# Patient Record
Sex: Female | Born: 1995 | Race: White | Hispanic: No | Marital: Single | Smoking: Never smoker
Health system: Southern US, Community
[De-identification: ages and names within clinical notes are randomized; demographics above are authoritative.]

## PROBLEM LIST (undated history)

## (undated) DIAGNOSIS — F4310 Post-traumatic stress disorder, unspecified: Secondary | ICD-10-CM

## (undated) DIAGNOSIS — F419 Anxiety disorder, unspecified: Secondary | ICD-10-CM

## (undated) HISTORY — PX: DENTAL SURGERY: SHX609

---
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
## 2020-07-10 ENCOUNTER — Other Ambulatory Visit: Payer: Self-pay | Admitting: Orthopedic Surgery

## 2020-07-10 ENCOUNTER — Other Ambulatory Visit: Payer: Self-pay

## 2020-07-10 ENCOUNTER — Ambulatory Visit
Admission: RE | Admit: 2020-07-10 | Discharge: 2020-07-10 | Disposition: A | Discharge status: Home / Self Care | Payer: Worker's Compensation | Source: Ambulatory Visit | Attending: Orthopedic Surgery | Admitting: Orthopedic Surgery

## 2020-07-10 DIAGNOSIS — M25561 Pain in right knee: Secondary | ICD-10-CM

## 2022-09-16 ENCOUNTER — Ambulatory Visit: Payer: 59 | Admitting: Internal Medicine

## 2024-04-21 ENCOUNTER — Emergency Department (HOSPITAL_BASED_OUTPATIENT_CLINIC_OR_DEPARTMENT_OTHER): Payer: Self-pay

## 2024-04-21 ENCOUNTER — Encounter (HOSPITAL_BASED_OUTPATIENT_CLINIC_OR_DEPARTMENT_OTHER): Payer: Self-pay | Admitting: Emergency Medicine

## 2024-04-21 ENCOUNTER — Other Ambulatory Visit: Payer: Self-pay

## 2024-04-21 ENCOUNTER — Emergency Department (HOSPITAL_BASED_OUTPATIENT_CLINIC_OR_DEPARTMENT_OTHER)
Admission: EM | Admit: 2024-04-21 | Discharge: 2024-04-21 | Disposition: A | Discharge status: Home / Self Care | Payer: Self-pay | Attending: Emergency Medicine | Admitting: Emergency Medicine

## 2024-04-21 DIAGNOSIS — B9620 Unspecified Escherichia coli [E. coli] as the cause of diseases classified elsewhere: Secondary | ICD-10-CM | POA: Insufficient documentation

## 2024-04-21 DIAGNOSIS — Z91040 Latex allergy status: Secondary | ICD-10-CM | POA: Insufficient documentation

## 2024-04-21 DIAGNOSIS — R109 Unspecified abdominal pain: Secondary | ICD-10-CM

## 2024-04-21 DIAGNOSIS — N390 Urinary tract infection, site not specified: Secondary | ICD-10-CM | POA: Insufficient documentation

## 2024-04-21 DIAGNOSIS — D72829 Elevated white blood cell count, unspecified: Secondary | ICD-10-CM | POA: Insufficient documentation

## 2024-04-21 LAB — COMPREHENSIVE METABOLIC PANEL WITH GFR
ALT: 13 U/L (ref 0–44)
AST: 17 U/L (ref 15–41)
Albumin: 4.5 g/dL (ref 3.5–5.0)
Alkaline Phosphatase: 56 U/L (ref 38–126)
Anion gap: 13 (ref 5–15)
BUN: 9 mg/dL (ref 6–20)
CO2: 22 mmol/L (ref 22–32)
Calcium: 9 mg/dL (ref 8.9–10.3)
Chloride: 107 mmol/L (ref 98–111)
Creatinine, Ser: 0.97 mg/dL (ref 0.44–1.00)
GFR, Estimated: >60 mL/min (ref >60)
Glucose, Bld: 90 mg/dL (ref 70–99)
Potassium: 3.9 mmol/L (ref 3.5–5.1)
Sodium: 141 mmol/L (ref 135–145)
Total Bilirubin: 0.4 mg/dL (ref 0.0–1.2)
Total Protein: 7.2 g/dL (ref 6.5–8.1)

## 2024-04-21 LAB — URINALYSIS, MICROSCOPIC (REFLEX)

## 2024-04-21 LAB — CBC WITH DIFFERENTIAL/PLATELET
Abs Immature Granulocytes: 0.05 K/uL (ref 0.00–0.07)
Basophils Absolute: 0.1 K/uL (ref 0.0–0.1)
Basophils Relative: 1 %
Eosinophils Absolute: 0.1 K/uL (ref 0.0–0.5)
Eosinophils Relative: 1 %
HCT: 40.8 % (ref 36.0–46.0)
Hemoglobin: 13.9 g/dL (ref 12.0–15.0)
Immature Granulocytes: 0 %
Lymphocytes Relative: 20 %
Lymphs Abs: 2.7 K/uL (ref 0.7–4.0)
MCH: 31.2 pg (ref 26.0–34.0)
MCHC: 34.1 g/dL (ref 30.0–36.0)
MCV: 91.7 fL (ref 80.0–100.0)
Monocytes Absolute: 1 K/uL (ref 0.1–1.0)
Monocytes Relative: 7 %
Neutro Abs: 9.4 K/uL — ABNORMAL HIGH (ref 1.7–7.7)
Neutrophils Relative %: 71 %
Platelets: 292 K/uL (ref 150–400)
RBC: 4.45 MIL/uL (ref 3.87–5.11)
RDW: 11.4 % — ABNORMAL LOW (ref 11.5–15.5)
WBC: 13.3 K/uL — ABNORMAL HIGH (ref 4.0–10.5)
nRBC: 0 % (ref 0.0–0.2)

## 2024-04-21 LAB — URINALYSIS, ROUTINE W REFLEX MICROSCOPIC
Bilirubin Urine: NEGATIVE
Glucose, UA: NEGATIVE mg/dL
Ketones, ur: NEGATIVE mg/dL
Nitrite: NEGATIVE
Protein, ur: 30 mg/dL — AB
Specific Gravity, Urine: 1.025 (ref 1.005–1.030)
pH: 5.5 (ref 5.0–8.0)

## 2024-04-21 LAB — PREGNANCY, URINE: Preg Test, Ur: NEGATIVE

## 2024-04-21 MED ORDER — CEPHALEXIN 500 MG PO CAPS
500.0000 mg | ORAL_CAPSULE | Freq: Two times a day (BID) | ORAL | 0 refills | Status: DC
Start: 2024-04-21 — End: 2024-04-21

## 2024-04-21 MED ORDER — KETOROLAC TROMETHAMINE 15 MG/ML IJ SOLN
15.0000 mg | Freq: Once | INTRAMUSCULAR | Status: AC
  Administered 2024-04-21: 15 mg via INTRAVENOUS
  Filled 2024-04-21: qty 1

## 2024-04-21 MED ORDER — SODIUM CHLORIDE 0.9 % IV BOLUS
1000.0000 mL | Freq: Once | INTRAVENOUS | Status: AC
  Administered 2024-04-21: 1000 mL via INTRAVENOUS

## 2024-04-21 MED ORDER — CEPHALEXIN 500 MG PO CAPS: 500.0000 mg | ORAL_CAPSULE | Freq: Four times a day (QID) | ORAL | 0 refills | Status: AC

## 2024-04-21 MED ORDER — SODIUM CHLORIDE 0.9 % IV SOLN
1.0000 g | Freq: Once | INTRAVENOUS | Status: AC
  Administered 2024-04-21: 1 g via INTRAVENOUS
  Filled 2024-04-21: qty 10

## 2024-04-21 MED ORDER — LACTATED RINGERS IV BOLUS
1000.0000 mL | Freq: Once | INTRAVENOUS | Status: DC
Start: 2024-04-21 — End: 2024-04-21

## 2024-04-21 MED ORDER — FLUCONAZOLE 150 MG PO TABS
150.0000 mg | ORAL_TABLET | Freq: Once | ORAL | Status: AC
  Administered 2024-04-21: 150 mg via ORAL
  Filled 2024-04-21: qty 1

## 2024-04-21 NOTE — ED Notes (Signed)
 Patient transported to CT

## 2024-04-21 NOTE — ED Notes (Signed)
 Pt sitting upright and conscious in bed. No apparent distress noted. No loner tearful at this time.

## 2024-04-21 NOTE — ED Provider Notes (Signed)
  EMERGENCY DEPARTMENT AT [facility] Provider Note   CSN: [id number] Arrival date & time: 04/21/24  1919     Patient presents with: Flank Pain   Alexandra Torres is a 28 y.o. female with no pertinent past medical history presents emergency department for evaluation of right flank pain, dysuria, frequency that has been present since Saturday 1 week ago.  Reports that she started feeling like she had a UTI 1 week ago so has been taking Azo for symptoms.  She reports that this typically resolves her UTI symptoms at this time and had not hence why she sought ED evaluation.  Pain worsens with laughing, movement, deep inspiration.  Pain is described as sharp stabbing pain in right flank. Had one episode of vomiting one week ago and intermittent nausea. Has not been eating this week consistently d/t pain, decreased appetite. Denies hx of kidney stone     Flank Pain       Prior to Admission medications   Medication Sig Start Date End Date Taking? Authorizing Provider  cephALEXin  (KEFLEX ) 500 MG capsule Take 1 capsule (500 mg total) by mouth 4 (four) times daily for 7 days. 04/21/24 04/28/24  Minnie Tinnie BRAVO, PA  ciprofloxacin  (CIPRO ) 500 MG tablet Take 1 tablet (500 mg total) by mouth 2 (two) times daily. 11/18/15   Desiderio Beagle, MD  phenazopyridine  (PYRIDIUM ) 200 MG tablet Take 1 tablet (200 mg total) by mouth 3 (three) times daily as needed for pain. 11/18/15   Desiderio Beagle, MD    Allergies: Latex    Review of Systems  Genitourinary:  Positive for flank pain.    Updated Vital Signs BP 104/76   Pulse 77   Temp 99.2 F (37.3 C) (Oral)   Resp 18   Ht 5' 6 (1.676 m)   Wt 59.9 kg   SpO2 100%   BMI 21.31 kg/m   Physical Exam Vitals and nursing note reviewed.  Constitutional:      General: She is not in acute distress.    Appearance: Normal appearance.  HENT:     Head: Normocephalic and atraumatic.  Eyes:     Conjunctiva/sclera: Conjunctivae normal.   Cardiovascular:     Rate and Rhythm: Tachycardia present.     Comments: Initially tachycardic at 124 bpm decreased to 89 bpm following IVF, antibiotics Pulmonary:     Effort: Pulmonary effort is normal. No respiratory distress.     Breath sounds: Normal breath sounds.  Abdominal:     General: Bowel sounds are normal.     Palpations: Abdomen is soft.     Tenderness: There is abdominal tenderness in the right lower quadrant, suprapubic area and left lower quadrant. There is right CVA tenderness. There is no left CVA tenderness.  Skin:    General: Skin is warm.     Capillary Refill: Capillary refill takes less than 2 seconds.     Coloration: Skin is not jaundiced or pale.  Neurological:     Mental Status: She is alert. Mental status is at baseline.     (all labs ordered are listed, but only abnormal results are displayed) Labs Reviewed  URINALYSIS, ROUTINE W REFLEX MICROSCOPIC - Abnormal; Notable for the following components:      Result Value   APPearance CLOUDY (*)    Hgb urine dipstick MODERATE (*)    Protein, ur 30 (*)    Leukocytes,Ua MODERATE (*)    All other components within normal limits  CBC WITH DIFFERENTIAL/PLATELET -  Abnormal; Notable for the following components:   WBC 13.3 (*)    RDW 11.4 (*)    Neutro Abs 9.4 (*)    All other components within normal limits  URINALYSIS, MICROSCOPIC (REFLEX) - Abnormal; Notable for the following components:   Bacteria, UA RARE (*)    All other components within normal limits  URINE CULTURE  PREGNANCY, URINE  COMPREHENSIVE METABOLIC PANEL WITH GFR    EKG: None  Radiology: CT Renal Stone Study Result Date: 04/21/2024 CLINICAL DATA:  Right-sided flank pain EXAM: CT ABDOMEN AND PELVIS WITHOUT CONTRAST TECHNIQUE: Multidetector CT imaging of the abdomen and pelvis was performed following the standard protocol without IV contrast. RADIATION DOSE REDUCTION: This exam was performed according to the departmental dose-optimization  program which includes automated exposure control, adjustment of the mA and/or kV according to patient size and/or use of iterative reconstruction technique. COMPARISON:  None Available. FINDINGS: Lower chest: No acute abnormality. Hepatobiliary: No focal liver abnormality is seen. No gallstones, gallbladder wall thickening, or biliary dilatation. Pancreas: Unremarkable. No pancreatic ductal dilatation or surrounding inflammatory changes. Spleen: Normal in size without focal abnormality. Adrenals/Urinary Tract: Adrenal glands are within normal limits. Kidneys demonstrate no renal calculi or obstructive changes. Bladder is partially distended. Stomach/Bowel: No obstructive or inflammatory changes of the colon are noted. The appendix appears within normal limits in the right lower quadrant. Small bowel and stomach are within normal limits. Vascular/Lymphatic: No significant vascular findings are present. No enlarged abdominal or pelvic lymph nodes. Reproductive: Uterus and bilateral adnexa are unremarkable. Other: No abdominal wall hernia or abnormality. No abdominopelvic ascites. Musculoskeletal: No acute or significant osseous findings. IMPRESSION: No acute abnormality noted. Electronically Signed   By: Oneil Devonshire M.D.   On: 04/21/2024 22:51      Medications Ordered in the ED  cefTRIAXone  (ROCEPHIN ) 1 g in sodium chloride  0.9 % 100 mL IVPB (0 g Intravenous Stopped 04/21/24 2339)  ketorolac  (TORADOL ) 15 MG/ML injection 15 mg (15 mg Intravenous Given 04/21/24 2251)  sodium chloride  0.9 % bolus 1,000 mL (0 mLs Intravenous Stopped 04/21/24 2351)  fluconazole  (DIFLUCAN ) tablet 150 mg (150 mg Oral Given 04/21/24 2341)                                    Medical Decision Making Amount and/or Complexity of Data Reviewed Labs: ordered. Radiology: ordered.  Risk Prescription drug management.   Patient presents to the ED for concern of dysuria, right flank pain, this involves an extensive number of  treatment options, and is a complaint that carries with it a high risk of complications and morbidity.  The differential diagnosis includes kidney stone, UTI, pyelonephritis, appendicitis, muscle strain   Co morbidities that complicate the patient evaluation  None   Additional history obtained:  Additional history obtained from Nursing   External records from outside source obtained and reviewed including triage RN note   Lab Tests:  I Ordered, and personally interpreted labs.  The pertinent results include:   Mild leukocytosis of 13.3 UA with moderate hemoglobin, moderate leukocytes, RBC, 21-50 WBC, rare bacteria, budding yeast   Imaging Studies ordered:  I ordered imaging studies including CT renal study I independently visualized and interpreted imaging which showed no stone nor acute intra-abdominal abnormality I agree with the radiologist interpretation   Cardiac Monitoring:  The patient was maintained on a cardiac monitor.     Medicines ordered and prescription drug management:  I ordered medication including Rocephin , NS, fluconazole , Keflex  for UTI, yeast infection Reevaluation of the patient after these medicines showed that the patient improved I have reviewed the patients home medicines and have made adjustments as needed     Problem List / ED Course:  UTI Right flank pain Mild leukocytosis of 13.3.  UA appears infectious with moderate hemoglobin.   Provided IVF, Rocephin  for suspected UTI Renal study without abnormalities, hydronephrosis, nor kidney stone Culture pending Candidiasis Budding yeast present in urine As she will also be taking a prescribed antibiotic for UTI, will treat with 1 dose of fluconazole    Reevaluation:  After the interventions noted above, I reevaluated the patient and found that they have :improved   Social Determinants of Health:  No pcp on file - provided recommendation   Dispostion:  After consideration of the  diagnostic results and the patients response to treatment, I feel that the patent would benefit from outpatient management symptomatic care  Discussed ED workup, disposition, return to ED precautions with patient who expresses understanding agrees with plan.  All questions answered to their satisfaction.  They are agreeable to plan.  Discharge instructions provided on paperwork  Final diagnoses:  Right flank pain  Urinary tract infection with hematuria, site unspecified    ED Discharge Orders          Ordered    cephALEXin  (KEFLEX ) 500 MG capsule  2 times daily,   Status:  Discontinued        04/21/24 2345    cephALEXin  (KEFLEX ) 500 MG capsule  4 times daily        04/21/24 2350               Minnie Tinnie BRAVO, PA 04/21/24 7646    Dreama Longs, MD 04/22/24 [phone]

## 2024-04-21 NOTE — Discharge Instructions (Signed)
 Thank you for letting us  evaluate you today.  Your lab work was notable for mild white count elevation which indicates infection/inflammation which is likely secondary to your urinary tract infection.  Other electrolytes are within normal limits.  Your urine appears infected with blood present.  There is also some yeast present so we have treated you for yeast infection with fluconazole  here in emergency department.  Fortunately, this is a one-time dose.  We have also provided you with IV antibiotics to kick start your treatment plan as well as IV fluids.  I have sent a prescription for Keflex  to your pharmacy for UTI.  Return to Emergency Department if you experience any acute worsening of flank, abdominal pain, intractable vomiting affecting your hydration status, altered mentation

## 2024-04-21 NOTE — ED Triage Notes (Signed)
 Pt c/o RT side back/flank pain- pain increases with movement UTI-like sxs x 1 wk

## 2024-04-24 LAB — URINE CULTURE: Culture: 70000 — AB

## 2024-04-25 ENCOUNTER — Telehealth (HOSPITAL_BASED_OUTPATIENT_CLINIC_OR_DEPARTMENT_OTHER): Payer: Self-pay | Admitting: *Deleted

## 2024-04-25 NOTE — Telephone Encounter (Signed)
 Post ED Visit - Positive Culture Follow-up  Culture report reviewed by antimicrobial stewardship pharmacist: [facility] [x]  Koren Or, Pharm.D. []  Venetia Gully, Pharm.D., BCPS AQ-ID []  Garrel Crews, Pharm.D., BCPS []  Almarie Lunger, Pharm.D., BCPS []  Brave, [address].D., BCPS, AAHIVP []  Rosaline Bihari, Pharm.D., BCPS, AAHIVP []  Vernell Meier, PharmD, BCPS []  Latanya Hint, PharmD, BCPS []  Donald Medley, PharmD, BCPS []  Rocky Bold, PharmD []  Dorothyann Alert, PharmD, BCPS []  Morene Babe, PharmD  [facility] []  Rosaline Edison, PharmD []  Romona Bliss, PharmD []  Dolphus Roller, PharmD []  Veva Seip, Rph []  Vernell Daunt) Leonce, PharmD []  Eva Allis, PharmD []  Rosaline Millet, PharmD []  Iantha Batch, PharmD []  Arvin Gauss, PharmD []  Wanda Hasting, PharmD []  Ronal Rav, PharmD []  Rocky Slade, PharmD []  Bard Jeans, PharmD   Positive urine culture Treated with cephalexin , organism sensitive to the same and no further patient follow-up is required at this time.  Alexandra Torres 04/25/2024, 11:35 AM

## 2024-05-03 ENCOUNTER — Ambulatory Visit
Admission: EM | Admit: 2024-05-03 | Discharge: 2024-05-03 | Disposition: A | Discharge status: Home / Self Care | Payer: PRIVATE HEALTH INSURANCE | Attending: Family Medicine | Admitting: Family Medicine

## 2024-05-03 DIAGNOSIS — R109 Unspecified abdominal pain: Secondary | ICD-10-CM | POA: Insufficient documentation

## 2024-05-03 DIAGNOSIS — N3001 Acute cystitis with hematuria: Secondary | ICD-10-CM | POA: Diagnosis present

## 2024-05-03 LAB — POCT URINALYSIS DIP (MANUAL ENTRY)
Blood, UA: NEGATIVE
Glucose, UA: 250 mg/dL — AB
Nitrite, UA: POSITIVE — AB
Protein Ur, POC: >=300 mg/dL — AB
Spec Grav, UA: <=1.005 — AB (ref 1.010–1.025)
Urobilinogen, UA: >=8 U/dL — AB
pH, UA: 5 (ref 5.0–8.0)

## 2024-05-03 LAB — POCT URINE PREGNANCY: Preg Test, Ur: NEGATIVE

## 2024-05-03 MED ORDER — CIPROFLOXACIN HCL 500 MG PO TABS: 500.0000 mg | ORAL_TABLET | Freq: Two times a day (BID) | ORAL | 0 refills | Status: AC

## 2024-05-03 MED ORDER — FLUCONAZOLE 150 MG PO TABS: 150.0000 mg | ORAL_TABLET | ORAL | 0 refills | Status: AC

## 2024-05-03 NOTE — Discharge Instructions (Addendum)
 Please start ciprofloxacin to address an urinary tract infection. Make sure you hydrate very well with plain water and a quantity of 80 ounces of water a day.  Please limit drinks that are considered urinary irritants such as soda, sweet tea, coffee, energy drinks, alcohol.  These can worsen your urinary and genital symptoms but also be the source of them.  I will let you know about your urine culture results through MyChart to see if we need to prescribe or change your antibiotics based off of those results.

## 2024-05-03 NOTE — ED Triage Notes (Signed)
 Pt c/o recent kidney infection-was seen in ED-completed IV abx and oral abx-states she does not feel better with cont'd right flank pain and dysuria-denies fever-NAD-steady gait

## 2024-05-03 NOTE — ED Provider Notes (Signed)
 [facility]  Note:  This document was prepared using Conservation officer, historic buildings and may include unintentional dictation errors.  MRN: [id number] DOB: 1996-06-12  Subjective:   Alexandra Torres is a 28 y.o. female presenting for recheck on persistent right flank pain.  Patient was seen through the [facility] emergency room on 04/21/2024.  Alexandra Torres underwent IM Rocephin  1g while there and then subsequently was prescribed cephalexin  as an outpatient.  Today, Alexandra Torres reports Alexandra Torres still feels unwell.  Still has dysuria, right lower flank pain, urinary urgency despite completing all her antibiotics.  Has been hydrating with just water at about 90 ounces daily.  No history of renal stones.  Has been using Azo and therefore the urine has been discolored so Alexandra Torres cannot appreciate any hematuria.  No current facility-administered medications for this encounter. No current outpatient medications on file.   Allergies  Allergen Reactions   Latex Rash    Past Medical History:  Diagnosis Date   Anxiety    PTSD (post-traumatic stress disorder)      Past Surgical History:  Procedure Laterality Date   DENTAL SURGERY      No family history on file.  Social History   Tobacco Use   Smoking status: Never   Smokeless tobacco: Never  Vaping Use   Vaping status: Never Used  Substance Use Topics   Alcohol use: No   Drug use: No    ROS   Objective:   Vitals: BP 103/69 (BP Location: Right Arm)   Pulse 69   Temp 98.5 F (36.9 C) (Oral)   Resp 16   LMP 05/03/2024   SpO2 96%   Physical Exam Constitutional:      General: Alexandra Torres is not in acute distress.    Appearance: Normal appearance. Alexandra Torres is well-developed. Alexandra Torres is not ill-appearing, toxic-appearing or diaphoretic.  HENT:     Head: Normocephalic and atraumatic.     Nose: Nose normal.     Mouth/Throat:     Mouth: Mucous membranes are moist.  Eyes:     General: No scleral icterus.       Right eye: No discharge.         Left eye: No discharge.     Extraocular Movements: Extraocular movements intact.     Conjunctiva/sclera: Conjunctivae normal.  Cardiovascular:     Rate and Rhythm: Normal rate.  Pulmonary:     Effort: Pulmonary effort is normal.  Abdominal:     General: Bowel sounds are normal. There is no distension.     Palpations: Abdomen is soft. There is no mass.     Tenderness: There is no abdominal tenderness. There is no right CVA tenderness, left CVA tenderness, guarding or rebound.  Musculoskeletal:     Lumbar back: Tenderness present. No swelling, edema, deformity, signs of trauma, lacerations, spasms or bony tenderness. Normal range of motion. Negative right straight leg raise test and negative left straight leg raise test. No scoliosis.       Back:  Skin:    General: Skin is warm and dry.  Neurological:     General: No focal deficit present.     Mental Status: Alexandra Torres is alert and oriented to person, place, and time.  Psychiatric:        Mood and Affect: Mood normal.        Behavior: Behavior normal.        Thought Content: Thought content normal.        Judgment: Judgment normal.  Results for orders placed or performed during the hospital encounter of 05/03/24 (from the past 24 hours)  POCT urinalysis dipstick     Status: Abnormal   Collection Time: 05/03/24  9:52 AM  Result Value Ref Range   Color, UA orange (A) yellow   Clarity, UA hazy (A) clear   Glucose, UA =250 (A) negative mg/dL   Bilirubin, UA moderate (A) negative   Ketones, POC UA small (15) (A) negative mg/dL   Spec Grav, UA <=8.994 (A) 1.010 - 1.025   Blood, UA negative negative   pH, UA 5.0 5.0 - 8.0   Protein Ur, POC >=300 (A) negative mg/dL   Urobilinogen, UA >=1.9 (A) 0.2 or 1.0 E.U./dL   Nitrite, UA Positive (A) Negative   Leukocytes, UA Large (3+) (A) Negative  POCT urine pregnancy     Status: None   Collection Time: 05/03/24  9:52 AM  Result Value Ref Range   Preg Test, Ur Negative Negative     Assessment and Plan :   PDMP not reviewed this encounter.  1. Acute cystitis with hematuria   2. Right flank pain    Patient is hemodynamically stable and without CVA tenderness.  As such, will defer repeat IM ceftriaxone .  Recommended a different class entirely using ciprofloxacin .  Urine culture pending.  Continue to hydrate well and avoid urinary irritants.  Use fluconazole  to stave off yeast infection from taking antibiotics.  Counseled patient on potential for adverse effects with medications prescribed/recommended today, ER and return-to-clinic precautions discussed, patient verbalized understanding.    Christopher Savannah, PA-C 05/03/24 1012

## 2024-05-04 LAB — URINE CULTURE: Culture: NO GROWTH

## 2024-05-07 ENCOUNTER — Ambulatory Visit (HOSPITAL_COMMUNITY): Payer: Self-pay
